# Patient Record
Sex: Female | Born: 2000 | Race: White | Hispanic: No | Marital: Single | Smoking: Current some day smoker
Health system: Southern US, Community
[De-identification: ages and names within clinical notes are randomized; demographics above are authoritative.]

## PROBLEM LIST (undated history)

## (undated) DIAGNOSIS — F329 Major depressive disorder, single episode, unspecified: Secondary | ICD-10-CM

## (undated) DIAGNOSIS — R51 Headache: Secondary | ICD-10-CM

## (undated) DIAGNOSIS — R519 Headache, unspecified: Secondary | ICD-10-CM

## (undated) DIAGNOSIS — K589 Irritable bowel syndrome without diarrhea: Secondary | ICD-10-CM

## (undated) DIAGNOSIS — F32A Depression, unspecified: Secondary | ICD-10-CM

## (undated) DIAGNOSIS — F419 Anxiety disorder, unspecified: Secondary | ICD-10-CM

## (undated) HISTORY — DX: Depression, unspecified: F32.A

## (undated) HISTORY — DX: Major depressive disorder, single episode, unspecified: F32.9

## (undated) HISTORY — DX: Anxiety disorder, unspecified: F41.9

---
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
## 2019-03-22 ENCOUNTER — Telehealth: Payer: Self-pay

## 2019-03-22 NOTE — Telephone Encounter (Signed)
Denied doxycycline pt need appt for refills

## 2019-04-16 ENCOUNTER — Telehealth: Payer: Self-pay | Admitting: Nurse Practitioner

## 2019-04-16 NOTE — Telephone Encounter (Signed)
Spoke to her grandmother who states that she is in [place] right now and its not a good time to schedule appointment at this time, will call to schedule sometime this summer

## 2019-07-06 ENCOUNTER — Telehealth: Payer: Self-pay

## 2019-07-06 NOTE — Telephone Encounter (Signed)
Reached out to patient in regards to gap in care per insurance and patient now lives in [place].
